# Patient Record
Sex: Male | Born: 1940 | Race: Black or African American | Hispanic: No | Marital: Married | Smoking: Never smoker
Health system: Southern US, Community
[De-identification: ages and names within clinical notes are randomized; demographics above are authoritative.]

## PROBLEM LIST (undated history)

## (undated) DIAGNOSIS — C61 Malignant neoplasm of prostate: Secondary | ICD-10-CM

## (undated) DIAGNOSIS — I10 Essential (primary) hypertension: Secondary | ICD-10-CM

## (undated) HISTORY — DX: Malignant neoplasm of prostate: C61

## (undated) HISTORY — DX: Essential (primary) hypertension: I10

## (undated) HISTORY — PX: CATARACT EXTRACTION: SUR2

---

## 2004-10-11 ENCOUNTER — Ambulatory Visit: Payer: Self-pay | Admitting: Radiation Oncology

## 2010-09-09 ENCOUNTER — Ambulatory Visit: Payer: Self-pay | Admitting: Family Medicine

## 2011-10-17 ENCOUNTER — Ambulatory Visit: Payer: Self-pay | Admitting: Family Medicine

## 2016-12-27 ENCOUNTER — Emergency Department: Payer: Medicare Other

## 2016-12-27 ENCOUNTER — Emergency Department
Admission: EM | Admit: 2016-12-27 | Discharge: 2016-12-27 | Disposition: A | Discharge status: Home / Self Care | Payer: Medicare Other | Attending: Student in an Organized Health Care Education/Training Program | Admitting: Student in an Organized Health Care Education/Training Program

## 2016-12-27 DIAGNOSIS — R55 Syncope and collapse: Secondary | ICD-10-CM | POA: Diagnosis not present

## 2016-12-27 DIAGNOSIS — Z7901 Long term (current) use of anticoagulants: Secondary | ICD-10-CM | POA: Insufficient documentation

## 2016-12-27 DIAGNOSIS — E860 Dehydration: Secondary | ICD-10-CM | POA: Diagnosis not present

## 2016-12-27 DIAGNOSIS — R51 Headache: Secondary | ICD-10-CM | POA: Diagnosis present

## 2016-12-27 LAB — BASIC METABOLIC PANEL
ANION GAP: 7 (ref 5–15)
BUN: 24 mg/dL — ABNORMAL HIGH (ref 6–20)
CHLORIDE: 106 mmol/L (ref 101–111)
CO2: 25 mmol/L (ref 22–32)
CREATININE: 1.21 mg/dL (ref 0.61–1.24)
Calcium: 9.6 mg/dL (ref 8.9–10.3)
GFR calc non Af Amer: 56 mL/min — ABNORMAL LOW (ref >60)
Glucose, Bld: 95 mg/dL (ref 65–99)
Potassium: 3.9 mmol/L (ref 3.5–5.1)
SODIUM: 138 mmol/L (ref 135–145)

## 2016-12-27 LAB — CBC
HCT: 34.4 % — ABNORMAL LOW (ref 40.0–52.0)
Hemoglobin: 11.3 g/dL — ABNORMAL LOW (ref 13.0–18.0)
MCH: 23.9 pg — ABNORMAL LOW (ref 26.0–34.0)
MCHC: 32.9 g/dL (ref 32.0–36.0)
MCV: 72.8 fL — ABNORMAL LOW (ref 80.0–100.0)
Platelets: 190 K/uL (ref 150–440)
RBC: 4.72 MIL/uL (ref 4.40–5.90)
RDW: 15.5 % — ABNORMAL HIGH (ref 11.5–14.5)
WBC: 7.2 K/uL (ref 3.8–10.6)

## 2016-12-27 LAB — TROPONIN I

## 2016-12-27 MED ORDER — SODIUM CHLORIDE 0.9 % IV BOLUS (SEPSIS)
500.0000 mL | Freq: Once | INTRAVENOUS | Status: AC
  Administered 2016-12-27: 500 mL via INTRAVENOUS

## 2016-12-27 NOTE — ED Triage Notes (Signed)
Pt arrives via [facility] from where he works. Pt told EMS he had a sharp pain in front of head and felt dizzy. Witnesses told EMS pt had a near syncopal episode. Pt states he had a dizzy spell and sat down, no LOC, no hitting head. Pt remembers event. Pt is alert and oriented on arrival. Negative stroke per EMS, VSS, CBG 130.

## 2016-12-27 NOTE — ED Notes (Signed)
Pt had stated to this RN that he couldn't find car keys. Pt was informed that this RN would contact EMS or communications to see if they found them in EMS truck. Family was with pt. Went back into room to let pt know that he would need to wait in lobby for EMS to call back about car keys but pt or family were neither in room.

## 2016-12-27 NOTE — ED Provider Notes (Signed)
[facility] Emergency Department Provider Note    First MD Initiated Contact with Patient 12/27/16 1529     (approximate)  I have reviewed the triage vital signs and the nursing notes.   HISTORY  Chief Complaint Near Syncope    HPI Darryl Garcia is a 76 y.o. male presents with complaint of frontal headache associated with feeling dizzy that occurred while at work today.  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  Denies any SOB or Cp.  No recent fevers.  Says that he was mopping the floor, became light headed and a fellow coworker saw him.  She came to help him to the bench, so there was no head injury.  He states that after sitting down on the bench for a time his symptoms improved.  Denies any recent fevers.  No blurry vision, confusion, numbness or tingling earlier this Am.  EMS was called after the episode. He was able to ambulate to the ambulance according to EMS.  He says that by the time EMS arrived his symptoms had resolved.     History reviewed. No pertinent past medical history. History reviewed. No pertinent family history. History reviewed. No pertinent surgical history. There are no active problems to display for this patient.     Prior to Admission medications   Medication Sig Start Date End Date Taking? Authorizing Provider  Enoxaparin Sodium (LOVENOX [place]) Inject 1 Package into the skin 2 (two) times daily.   Yes Historical Provider, MD    Allergies Patient has no known allergies.    Social History Social History  Substance Use Topics  . Smoking status: Never Smoker  . Smokeless tobacco: Not on file  . Alcohol use No    Review of Systems Patient denies headaches, rhinorrhea, blurry vision, numbness, shortness of breath, chest pain, edema, cough, abdominal pain, nausea, vomiting, diarrhea, dysuria, fevers, rashes or hallucinations unless otherwise stated above in HPI. ____________________________________________   PHYSICAL  EXAM:  VITAL SIGNS: Vitals:   12/27/16 1830 12/27/16 1845  BP: (!) 149/91 (!) 143/88  Pulse: 78 73  Resp: 19 (!) 21    Constitutional: Alert and oriented. Well appearing and in no acute distress. Eyes: Conjunctivae are normal. PERRL. EOMI. Head: Atraumatic. Nose: No congestion/rhinnorhea. Mouth/Throat: Mucous membranes are moist.  Oropharynx non-erythematous. Neck: No stridor. Painless ROM. No cervical spine tenderness to palpation Hematological/Lymphatic/Immunilogical: No cervical lymphadenopathy. Cardiovascular: Normal rate, regular rhythm. Grossly normal heart sounds.  Good peripheral circulation. Respiratory: Normal respiratory effort.  No retractions. Lungs CTAB. Gastrointestinal: Soft and nontender. No distention. No abdominal bruits. No CVA tenderness.  Musculoskeletal: No lower extremity tenderness nor edema.  No joint effusions. Neurologic:  CN- intact.  No facial droop, Normal FNF.  Normal heel to shin.  Sensation intact bilaterally. Normal speech and language. No gross focal neurologic deficits are appreciated. No gait instability.  Skin:  Skin is warm, dry and intact. No rash noted. Psychiatric: Mood and affect are normal. Speech and behavior are normal.  ____________________________________________   LABS (all labs ordered are listed, but only abnormal results are displayed)  Results for orders placed or performed during the hospital encounter of 12/27/16 (from the past 24 hour(s))  Basic metabolic panel     Status: Abnormal   Collection Time: 12/27/16  3:21 PM  Result Value Ref Range   Sodium 138 135 - 145 mmol/L   Potassium 3.9 3.5 - 5.1 mmol/L   Chloride 106 101 - 111 mmol/L   CO2 25 22 -  32 mmol/L   Glucose, Bld 95 65 - 99 mg/dL   BUN 24 (H) 6 - 20 mg/dL   Creatinine, Ser 1.21 0.61 - 1.24 mg/dL   Calcium 9.6 8.9 - 10.3 mg/dL   GFR calc non Af Amer 56 (L) >60 mL/min   GFR calc Af Amer >60 >60 mL/min   Anion gap 7 5 - 15  CBC     Status: Abnormal    Collection Time: 12/27/16  3:21 PM  Result Value Ref Range   WBC 7.2 3.8 - 10.6 K/uL   RBC 4.72 4.40 - 5.90 MIL/uL   Hemoglobin 11.3 (L) 13.0 - 18.0 g/dL   HCT 34.4 (L) 40.0 - 52.0 %   MCV 72.8 (L) 80.0 - 100.0 fL   MCH 23.9 (L) 26.0 - 34.0 pg   MCHC 32.9 32.0 - 36.0 g/dL   RDW 15.5 (H) 11.5 - 14.5 %   Platelets 190 150 - 440 K/uL  Troponin I     Status: None   Collection Time: 12/27/16  3:21 PM  Result Value Ref Range   Troponin I <0.03 <0.03 ng/mL   ____________________________________________  EKG My review and personal interpretation at Time: 15:20   Indication: near syncope  Rate: 90  Rhythm: sinus Axis: normal Other: non specific st changes, normal intervals ____________________________________________  RADIOLOGY  I personally reviewed all radiographic images ordered to evaluate for the above acute complaints and reviewed radiology reports and findings.  These findings were personally discussed with the patient.  Please see medical record for radiology report.  ____________________________________________   PROCEDURES  Procedure(s) performed:  Procedures    Critical Care performed: no ____________________________________________   INITIAL IMPRESSION / ASSESSMENT AND PLAN / ED COURSE  Pertinent labs & imaging results that were available during my care of the patient were reviewed by me and considered in my medical decision making (see chart for details).  DDX: dehydration, tia, seizure, acs, dysrhythmia, migrain, cluster, sah, sdh  [name] is a 76 y.o. who presents to the ED with headache and near syncopal episode.  Patient is AFVSS in ED. Exam as above. Given current presentation have considered the above differential.  CT head ordered due to concern for AICA shows stable small vessel changes.  No deficits at this time.  Description sounds less like cva or tia.  Possible dehydration.  EKG without ischemia or dysrhythmia.  Trop negative.  Do not feel this  is consistent with ACS or dysrhythmia as he had no chest pain, palpitations or SOB.  Abdominal exam is soft and benign.  No pulsatile mass.  Will provide IVF and reassess.    Clinical Course as of Dec 28 2207  Tue Dec 27, 2016  1753 Patient reassessed. Remains asymptomatic. He is tolerating oral hydration. Mild dehydration on labs but no AKI. Patient receiving IV fluids.  Not consistent with ACS. No evidence of acute intracranial abnormality. No evidence of heart failure or pneumonia.  Patient was able to tolerate PO and was able to ambulate with a steady gait.  Have discussed with the patient and available family all diagnostics and treatments performed thus far and all questions were answered to the best of my ability. The patient demonstrates understanding and agreement with plan.   [PR]    Clinical Course User Index [PR] Merlyn Lot, MD     ____________________________________________   FINAL CLINICAL IMPRESSION(S) / ED DIAGNOSES  Final diagnoses:  Near syncope  Dehydration      NEW MEDICATIONS STARTED DURING  THIS VISIT:  There are no discharge medications for this patient.    Note:  This document was prepared using Dragon voice recognition software and may include unintentional dictation errors.    Merlyn Lot, MD 12/27/16 2211

## 2016-12-27 NOTE — ED Notes (Signed)
Pt states he ate cereal and peanut butter today, that's what he normally eats. Pt denies blurred vision, talking in complete sentences. Pt states pressure on forehead. Pt denies hx of headaches. Denies dizziness at present time. Denies N&V. States he feels fine and is ready to go.

## 2016-12-27 NOTE — ED Notes (Signed)
Pt ambulated with no assistance. No dizziness, no blurred vision. Pt feels well.

## 2017-05-31 IMAGING — CT CT HEAD W/O CM
3 series · 15 of 47 positions shown, 18 images · non-contrast
Comparison: None.

CLINICAL DATA: Dizziness, frontal pain, near syncopal episode

EXAM:
CT HEAD WITHOUT CONTRAST
TECHNIQUE: Contiguous axial images were obtained from the base of the skull
through the vertex without intravenous contrast.

[Series 2: head wo · axial · 0.43mm/px · z∈[-111,+14]mm · 9 of 31 slices shown, 12 images]
[im 3/31  brain]
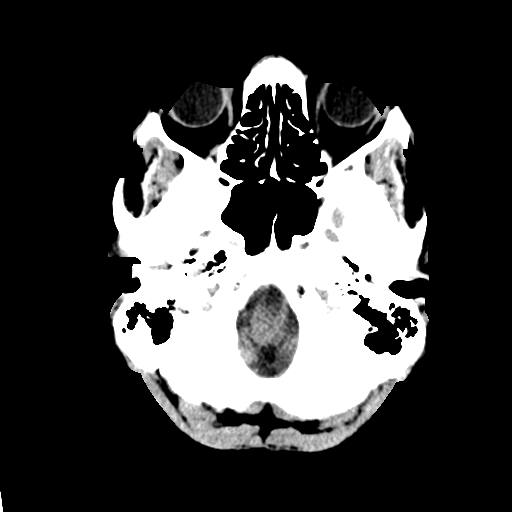
[im 3/31  bone]
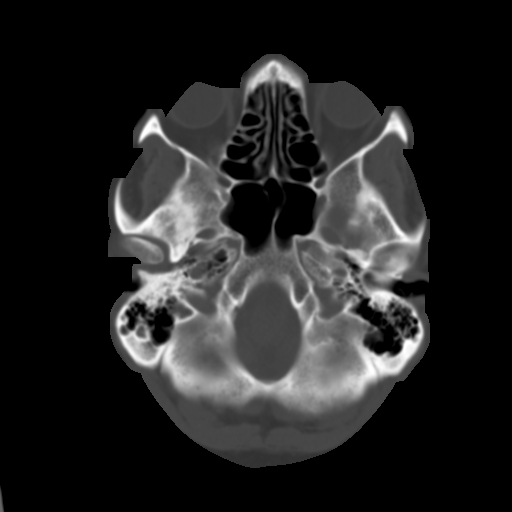
[im 6/31  brain]
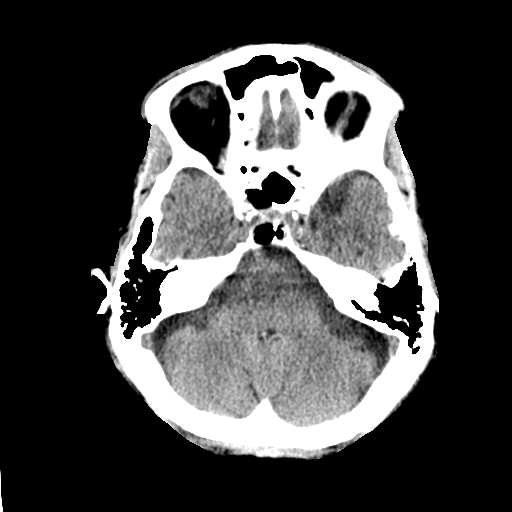
[im 9/31  brain]
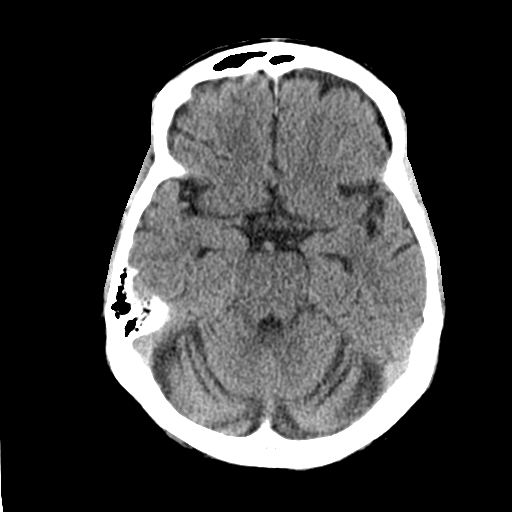
[im 12/31  brain]
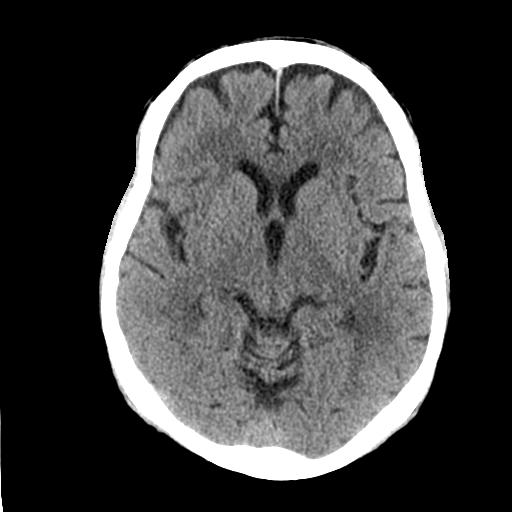
[im 16/31  brain]
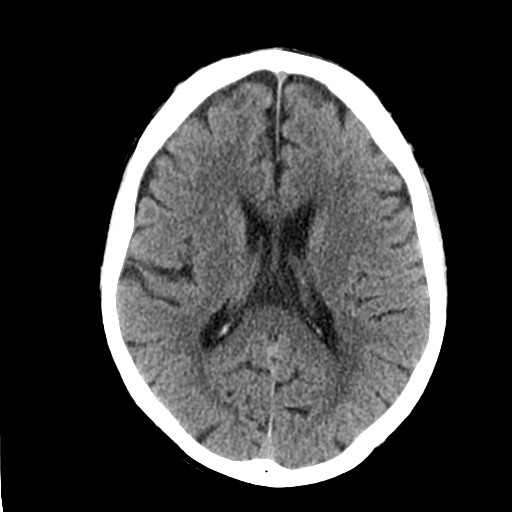
[im 16/31  bone]
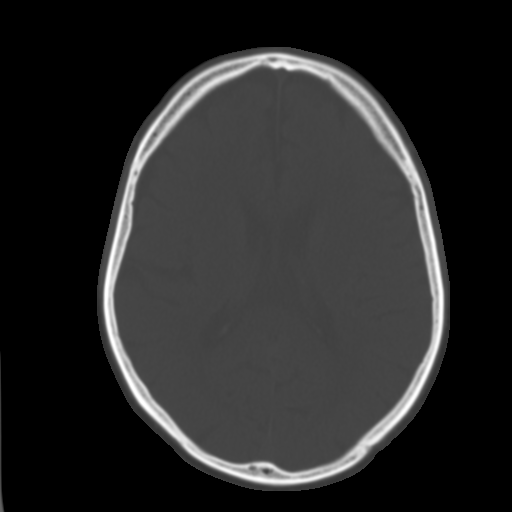
[im 19/31  brain]
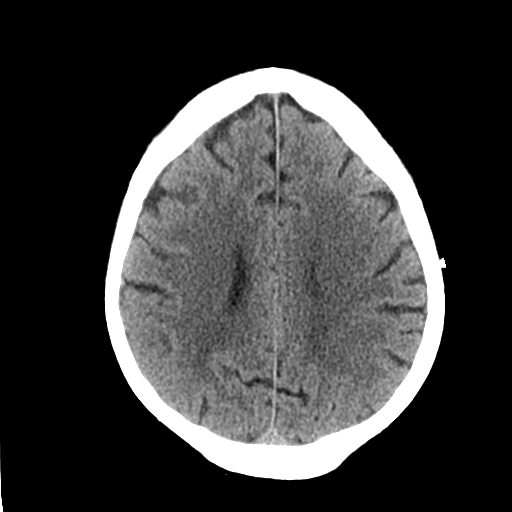
[im 22/31  brain]
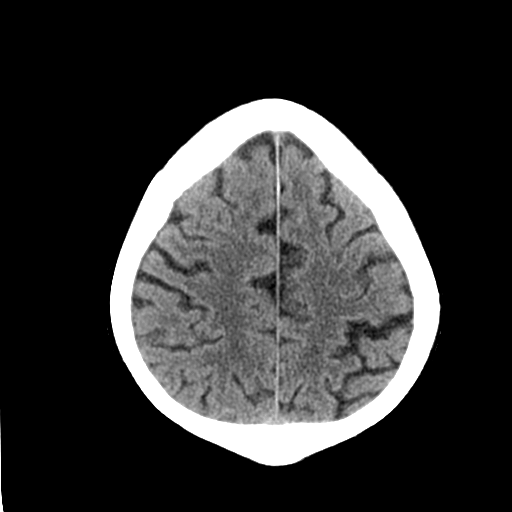
[im 25/31  brain]
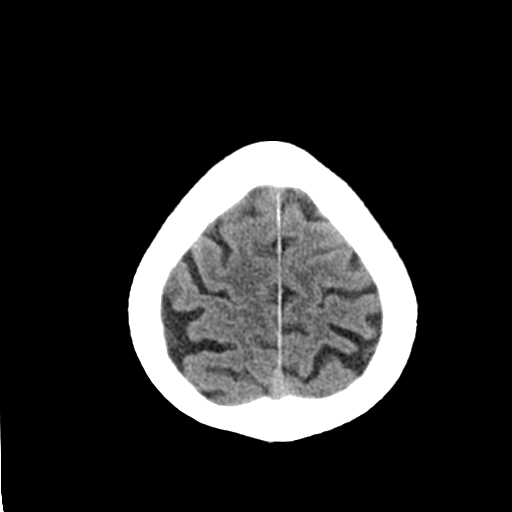
[im 28/31  brain]
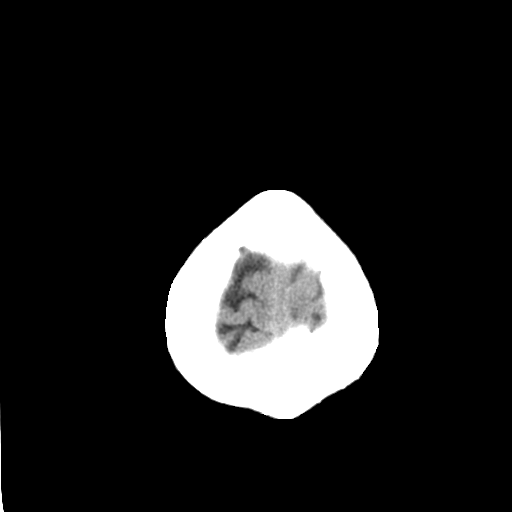
[im 28/31  bone]
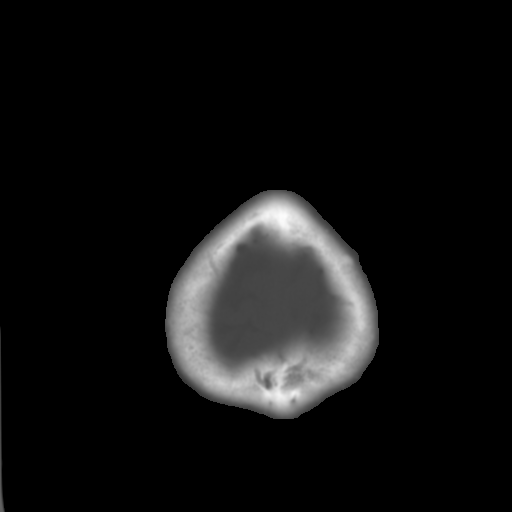

[Series 4: coronal soft tissue · coronal · 0.33mm/px · 3 of 65 slices shown]
[im 22/65  brain]
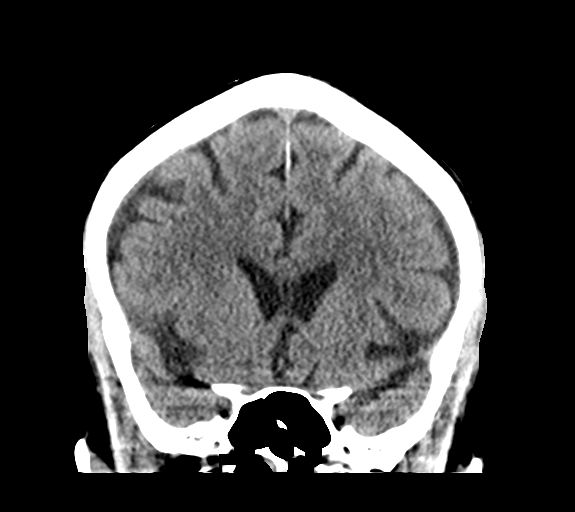
[im 29/65  brain]
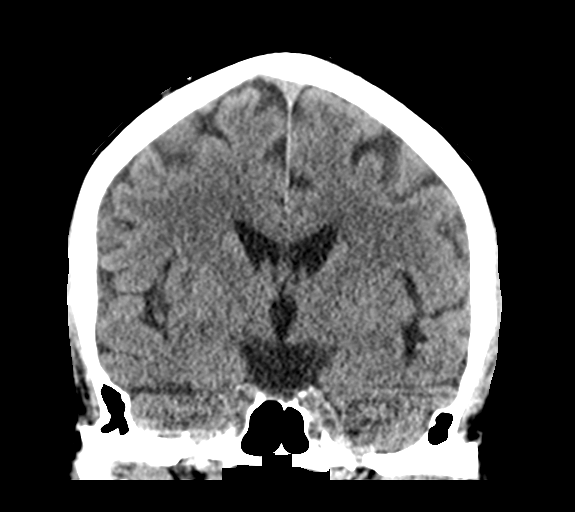
[im 36/65  brain]
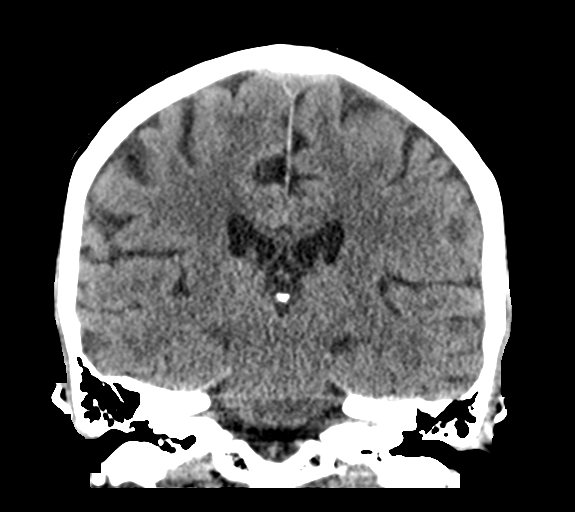

[Series 5: sagittal soft tissue · sagittal · 0.31mm/px · 3 of 54 slices shown]
[im 18/54  brain]
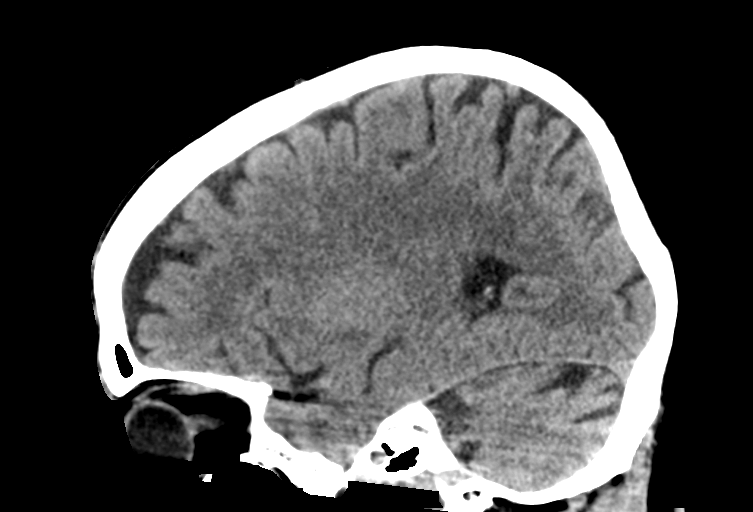
[im 27/54  brain]
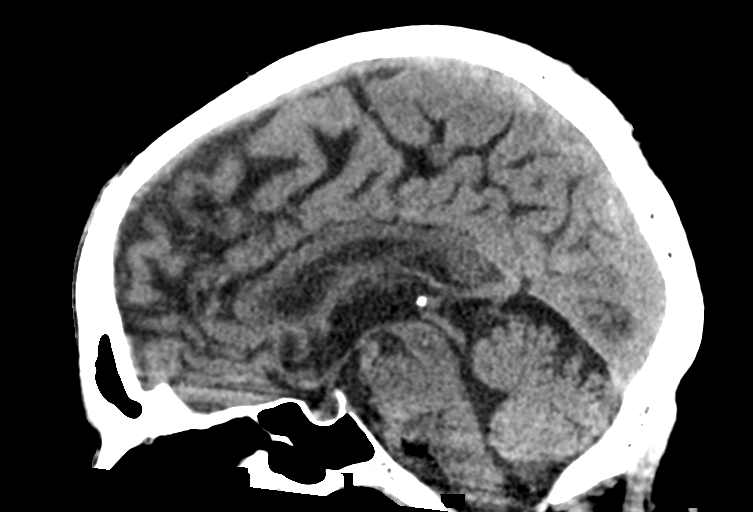
[im 36/54  brain]
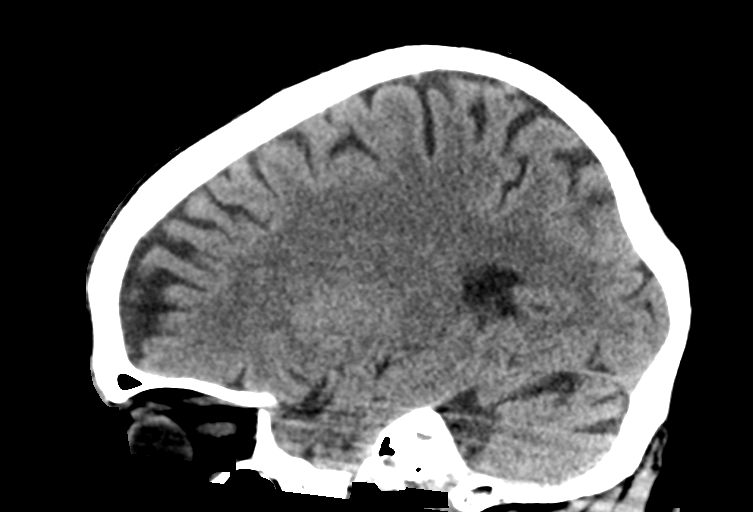

[15 of 47 positions shown; findings below may reference images not displayed]

FINDINGS: Brain: The ventricular system is normal in size and configuration
and the septum is midline in position. There are somewhat prominent
cortical sulci and cerebellar folia present consistent with atrophy.
Mild small vessel ischemic change is noted in the periventricular
white matter. No hemorrhage, mass lesion, or acute infarction is
seen.

Vascular: No vascular abnormality is seen on this unenhanced study.

Skull: No calvarial abnormality is seen.

Sinuses/Orbits: The paranasal sinuses that are visualized are
pneumatized.

Other: None.
IMPRESSION: Atrophy and mild small vessel ischemic change. No acute intracranial
abnormality.

## 2022-03-10 ENCOUNTER — Encounter: Payer: Self-pay | Admitting: Nurse Practitioner

## 2022-03-10 ENCOUNTER — Ambulatory Visit (INDEPENDENT_AMBULATORY_CARE_PROVIDER_SITE_OTHER): Payer: Medicare HMO | Admitting: Nurse Practitioner

## 2022-03-10 VITALS — BP 125/66 | HR 68 | Ht 70.0 in | Wt 174.9 lb

## 2022-03-10 DIAGNOSIS — I10 Essential (primary) hypertension: Secondary | ICD-10-CM

## 2022-03-10 DIAGNOSIS — C61 Malignant neoplasm of prostate: Secondary | ICD-10-CM | POA: Diagnosis not present

## 2022-03-10 DIAGNOSIS — D649 Anemia, unspecified: Secondary | ICD-10-CM | POA: Diagnosis not present

## 2022-03-10 DIAGNOSIS — Z7689 Persons encountering health services in other specified circumstances: Secondary | ICD-10-CM | POA: Diagnosis not present

## 2022-03-10 NOTE — Progress Notes (Signed)
? ?New Patient Office Visit ? ?Subjective:  ?Patient ID: Darryl Garcia, male    DOB: 07-29-1941  Age: 81 y.o. MRN: [id number] ? ?CC:  ?Chief Complaint  ?Patient presents with  ? New Patient (Initial Visit)  ? ? ?HPI ?Darryl Garcia presents for establishing care. 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  ? ?Patient has h/o hypertension, anemia, malignant neoplasm of prostate, choroidal nevus, complex regional pain syndrome and cataract. ? ?Past Medical History:  ?Diagnosis Date  ? Hypertension   ? Prostate cancer ([place])   ? ? ?Past Surgical History:  ?Procedure Laterality Date  ? CATARACT EXTRACTION N/A   ? ? ?History reviewed. No pertinent family history. ? ?Social History  ? ?Socioeconomic History  ? Marital status: Married  ?  Spouse name: Not on file  ? Number of children: Not on file  ? Years of education: Not on file  ? Highest education level: Not on file  ?Occupational History  ? Not on file  ?Tobacco Use  ? Smoking status: Never  ? Smokeless tobacco: Not on file  ?Substance and Sexual Activity  ? Alcohol use: No  ? Drug use: Never  ? Sexual activity: Not Currently  ?Other Topics Concern  ? Not on file  ?Social History Narrative  ? Not on file  ? ?Social Determinants of Health  ? ?Financial Resource Strain: Not on file  ?Food Insecurity: Not on file  ?Transportation Needs: Not on file  ?Physical Activity: Not on file  ?Stress: Not on file  ?Social Connections: Not on file  ?Intimate Partner Violence: Not on file  ? ? ?ROS ?Review of Systems  ?Constitutional:  Negative for activity change and appetite change.  ?HENT:  Positive for hearing loss. Negative for congestion, ear discharge and sore throat.   ?Eyes: Negative.   ?Respiratory:  Negative for cough, chest tightness and wheezing.   ?Cardiovascular:  Negative for chest pain and palpitations.  ?Gastrointestinal:  Negative for abdominal distention and blood in stool.  ?Genitourinary:  Positive for frequency and urgency.   ?Musculoskeletal:  Positive for myalgias.  ?Skin:  Negative for color change and pallor.  ?Neurological:  Negative for dizziness, light-headedness and headaches.  ?Psychiatric/Behavioral:  Negative for agitation, behavioral problems and confusion.   ? ?Objective:  ? ?Today's Vitals: BP 125/66   Pulse 68   Ht '5\' 10"'$  (1.778 m)   Wt 174 lb 14.4 oz (79.3 kg)   BMI 25.10 kg/m?  ? ?Physical Exam ?Constitutional:   ?   Appearance: Normal appearance. He is well-developed.  ?HENT:  ?   Head: Normocephalic.  ?   Right Ear: Tympanic membrane normal.  ?   Left Ear: Tympanic membrane normal.  ?   Mouth/Throat:  ?   Lips: Pink.  ?   Mouth: Mucous membranes are moist.  ?   Pharynx: Oropharynx is clear.  ?Eyes:  ?   Extraocular Movements: Extraocular movements intact.  ?   Conjunctiva/sclera: Conjunctivae normal.  ?   Pupils: Pupils are equal, round, and reactive to light.  ?Cardiovascular:  ?   Rate and Rhythm: Normal rate and regular rhythm.  ?   Pulses: Normal pulses.  ?   Heart sounds: Normal heart sounds.  ?Pulmonary:  ?   Effort: Pulmonary effort is normal.  ?   Breath sounds: Normal breath sounds.  ?Abdominal:  ?   General: Bowel sounds are normal.  ?   Palpations: Abdomen is soft.  ?Musculoskeletal:  ?  Cervical back: Normal range of motion.  ?Skin: ?   Capillary Refill: Capillary refill takes less than 2 seconds.  ?Neurological:  ?   General: No focal deficit present.  ?   Mental Status: He is alert and oriented to person, place, and time. Mental status is at baseline.  ?Psychiatric:     ?   Mood and Affect: Mood normal.     ?   Behavior: Behavior normal.     ?   Thought Content: Thought content normal.     ?   Judgment: Judgment normal.  ? ? ?Assessment & Plan:  ? ?Problem List Items Addressed This Visit   ? ?  ? Cardiovascular and Mediastinum  ? Hypertension  ?  BP 125/66 in the office ?Continue the current medication lisinopril and amlodipine.  ?  ?  ? Relevant Medications  ? amLODipine (NORVASC) 10 MG tablet  ?  aspirin 81 MG EC tablet  ? lisinopril (ZESTRIL) 20 MG tablet  ?  ? Genitourinary  ? Prostate cancer ([place])  ?  Followed by the oncologist. ?  ?  ? Relevant Medications  ? abiraterone acetate (ZYTIGA) 250 MG tablet  ? aspirin 81 MG EC tablet  ? predniSONE (DELTASONE) 5 MG tablet  ?  ? Other  ? Encounter to establish care with new doctor - Primary  ?  Care established. ?Labs ordered. ?  ?  ? Anemia  ?  Continue taking iron supplement. ?  ?  ? Relevant Medications  ? ferrous sulfate 325 (65 FE) MG tablet  ? ? ?Outpatient Encounter Medications as of 03/10/2022  ?Medication Sig  ? abiraterone acetate (ZYTIGA) 250 MG tablet Take 250 mg by mouth in the morning, at noon, in the evening, and at bedtime.  ? amLODipine (NORVASC) 10 MG tablet Take 1 tablet by mouth daily.  ? aspirin 81 MG EC tablet Take 81 mg by mouth daily at 2 PM.  ? ferrous sulfate 325 (65 FE) MG tablet Take 1 tablet by mouth daily.  ? lisinopril (ZESTRIL) 20 MG tablet Take 10 mg by mouth daily at 2 PM.  ? predniSONE (DELTASONE) 5 MG tablet Take 5 mg by mouth every other day.  ? Enoxaparin Sodium (LOVENOX [place]) Inject 1 Package into the skin 2 (two) times daily.  ? ?No facility-administered encounter medications on file as of 03/10/2022.  ? ? ?Follow-up: No follow-ups on file.  ? ?Theresia Lo, NP ? ?

## 2022-03-11 ENCOUNTER — Encounter: Payer: Self-pay | Admitting: Nurse Practitioner

## 2022-03-11 ENCOUNTER — Ambulatory Visit (INDEPENDENT_AMBULATORY_CARE_PROVIDER_SITE_OTHER): Payer: Medicare HMO

## 2022-03-11 DIAGNOSIS — Z0000 Encounter for general adult medical examination without abnormal findings: Secondary | ICD-10-CM | POA: Diagnosis not present

## 2022-03-11 DIAGNOSIS — Z7689 Persons encountering health services in other specified circumstances: Secondary | ICD-10-CM

## 2022-03-11 NOTE — Addendum Note (Signed)
Addended by: Anson [place] R on: 03/11/2022 02:09 PM ? ? Modules accepted: Orders ? ?

## 2022-03-12 ENCOUNTER — Encounter: Payer: Self-pay | Admitting: Nurse Practitioner

## 2022-03-12 DIAGNOSIS — I10 Essential (primary) hypertension: Secondary | ICD-10-CM | POA: Insufficient documentation

## 2022-03-12 DIAGNOSIS — Z7689 Persons encountering health services in other specified circumstances: Secondary | ICD-10-CM | POA: Insufficient documentation

## 2022-03-12 DIAGNOSIS — C61 Malignant neoplasm of prostate: Secondary | ICD-10-CM | POA: Insufficient documentation

## 2022-03-12 DIAGNOSIS — D649 Anemia, unspecified: Secondary | ICD-10-CM | POA: Insufficient documentation

## 2022-03-12 LAB — COMPLETE METABOLIC PANEL WITH GFR
AG Ratio: 1.7 (calc) (ref 1.0–2.5)
ALT: 21 U/L (ref 9–46)
AST: 19 U/L (ref 10–35)
Albumin: 4.2 g/dL (ref 3.6–5.1)
Alkaline phosphatase (APISO): 110 U/L (ref 35–144)
BUN: 17 mg/dL (ref 7–25)
CO2: 27 mmol/L (ref 20–32)
Calcium: 10 mg/dL (ref 8.6–10.3)
Chloride: 107 mmol/L (ref 98–110)
Creat: 1.22 mg/dL (ref 0.70–1.22)
Globulin: 2.5 g/dL (calc) (ref 1.9–3.7)
Glucose, Bld: 94 mg/dL (ref 65–99)
Potassium: 4.7 mmol/L (ref 3.5–5.3)
Sodium: 144 mmol/L (ref 135–146)
Total Bilirubin: 0.4 mg/dL (ref 0.2–1.2)
Total Protein: 6.7 g/dL (ref 6.1–8.1)
eGFR: 59 mL/min/{1.73_m2} — ABNORMAL LOW (ref >=60)

## 2022-03-12 LAB — CBC WITH DIFFERENTIAL/PLATELET
Absolute Monocytes: 281 cells/uL (ref 200–950)
Basophils Absolute: 18 cells/uL (ref 0–200)
Basophils Relative: 0.3 %
Eosinophils Absolute: 49 cells/uL (ref 15–500)
Eosinophils Relative: 0.8 %
HCT: 35.4 % — ABNORMAL LOW (ref 38.5–50.0)
Hemoglobin: 11.2 g/dL — ABNORMAL LOW (ref 13.2–17.1)
Lymphs Abs: 1403 cells/uL (ref 850–3900)
MCH: 23.9 pg — ABNORMAL LOW (ref 27.0–33.0)
MCHC: 31.6 g/dL — ABNORMAL LOW (ref 32.0–36.0)
MCV: 75.6 fL — ABNORMAL LOW (ref 80.0–100.0)
MPV: 12.7 fL — ABNORMAL HIGH (ref 7.5–12.5)
Monocytes Relative: 4.6 %
Neutro Abs: 4349 cells/uL (ref 1500–7800)
Neutrophils Relative %: 71.3 %
Platelets: 153 10*3/uL (ref 140–400)
RBC: 4.68 10*6/uL (ref 4.20–5.80)
RDW: 15.7 % — ABNORMAL HIGH (ref 11.0–15.0)
Total Lymphocyte: 23 %
WBC: 6.1 10*3/uL (ref 3.8–10.8)

## 2022-03-12 LAB — LIPID PANEL
Cholesterol: 215 mg/dL — ABNORMAL HIGH (ref <200)
HDL: 94 mg/dL (ref >=40)
LDL Cholesterol (Calc): 108 mg/dL (calc) — ABNORMAL HIGH
Non-HDL Cholesterol (Calc): 121 mg/dL (calc) (ref <130)
Total CHOL/HDL Ratio: 2.3 (calc) (ref <5.0)
Triglycerides: 44 mg/dL (ref <150)

## 2022-03-12 LAB — PSA: PSA: <0.04 ng/mL (ref <=4.00)

## 2022-03-12 LAB — TSH: TSH: 0.05 mIU/L — ABNORMAL LOW (ref 0.40–4.50)

## 2022-03-12 NOTE — Assessment & Plan Note (Signed)
Care established. Labs ordered. 

## 2022-03-12 NOTE — Assessment & Plan Note (Signed)
Continue taking iron supplement. ?

## 2022-03-12 NOTE — Assessment & Plan Note (Signed)
Followed by the oncologist.  

## 2022-03-12 NOTE — Assessment & Plan Note (Signed)
BP 125/66 in the office ?Continue the current medication lisinopril and amlodipine.  ?

## 2022-03-17 ENCOUNTER — Ambulatory Visit (INDEPENDENT_AMBULATORY_CARE_PROVIDER_SITE_OTHER): Payer: Medicare HMO | Admitting: Nurse Practitioner

## 2022-03-17 ENCOUNTER — Encounter: Payer: Self-pay | Admitting: Nurse Practitioner

## 2022-03-17 VITALS — BP 140/63 | HR 55 | Ht 70.0 in | Wt 175.4 lb

## 2022-03-17 DIAGNOSIS — D649 Anemia, unspecified: Secondary | ICD-10-CM

## 2022-03-17 DIAGNOSIS — R2689 Other abnormalities of gait and mobility: Secondary | ICD-10-CM

## 2022-03-17 DIAGNOSIS — E785 Hyperlipidemia, unspecified: Secondary | ICD-10-CM | POA: Diagnosis not present

## 2022-03-17 MED ORDER — FOLDING WALKING CANE MISC: 1.0000 | 0 refills | Status: AC | PRN

## 2022-03-17 MED ORDER — FOLDING WALKING CANE MISC: 1.0000 | 0 refills | Status: DC | PRN

## 2022-03-17 NOTE — Progress Notes (Signed)
? ?New Patient Office Visit ? ?Subjective:  ?Patient ID: Darryl Garcia, male    DOB: Sep 26, 1941  Age: 81 y.o. MRN: [id number] ? ?CC:  ?Chief Complaint  ?Patient presents with  ? Lab Results  ? ? ?HPI ? ?Patient has h/o hypertension, anemia, malignant neoplasm of prostate, choroidal nevus, complex regional pain syndrome and cataract. Patient states that he has balance problem while walking.  ? ?Past Medical History:  ?Diagnosis Date  ? Hypertension   ? Prostate cancer ([place])   ? ? ?Past Surgical History:  ?Procedure Laterality Date  ? CATARACT EXTRACTION N/A   ? ? ?History reviewed. No pertinent family history. ? ?Social History  ? ?Socioeconomic History  ? Marital status: Married  ?  Spouse name: Not on file  ? Number of children: Not on file  ? Years of education: Not on file  ? Highest education level: Not on file  ?Occupational History  ? Not on file  ?Tobacco Use  ? Smoking status: Never  ? Smokeless tobacco: Not on file  ?Substance and Sexual Activity  ? Alcohol use: No  ? Drug use: Never  ? Sexual activity: Not Currently  ?Other Topics Concern  ? Not on file  ?Social History Narrative  ? Not on file  ? ?Social Determinants of Health  ? ?Financial Resource Strain: Not on file  ?Food Insecurity: Not on file  ?Transportation Needs: Not on file  ?Physical Activity: Not on file  ?Stress: Not on file  ?Social Connections: Not on file  ?Intimate Partner Violence: Not on file  ? ? ?ROS ?Review of Systems  ?Constitutional:  Negative for activity change and appetite change.  ?HENT:  Positive for hearing loss. Negative for congestion, ear discharge and sore throat.   ?Eyes: Negative.   ?Respiratory:  Negative for cough, chest tightness and wheezing.   ?Cardiovascular:  Negative for chest pain and palpitations.  ?Gastrointestinal:  Negative for abdominal distention and blood in stool.  ?Genitourinary:  Positive for frequency and urgency.  ?Musculoskeletal:  Positive for myalgias.  ?Skin:  Negative for color change and  pallor.  ?Neurological:  Negative for dizziness, light-headedness and headaches.  ?Psychiatric/Behavioral:  Negative for agitation, behavioral problems and confusion.   ? ?Objective:  ? ?Today's Vitals: BP 140/63   Pulse (!) 55   Ht '5\' 10"'$  (1.778 m)   Wt 175 lb 6.4 oz (79.6 kg)   BMI 25.17 kg/m?  ? ?Physical Exam ?Constitutional:   ?   Appearance: Normal appearance. He is well-developed.  ?HENT:  ?   Head: Normocephalic.  ?   Right Ear: Tympanic membrane normal.  ?   Left Ear: Tympanic membrane normal.  ?   Mouth/Throat:  ?   Lips: Pink.  ?   Mouth: Mucous membranes are moist.  ?   Pharynx: Oropharynx is clear.  ?Eyes:  ?   Extraocular Movements: Extraocular movements intact.  ?   Conjunctiva/sclera: Conjunctivae normal.  ?   Pupils: Pupils are equal, round, and reactive to light.  ?Cardiovascular:  ?   Rate and Rhythm: Normal rate and regular rhythm.  ?   Pulses: Normal pulses.  ?   Heart sounds: Normal heart sounds.  ?Pulmonary:  ?   Effort: Pulmonary effort is normal.  ?   Breath sounds: Normal breath sounds.  ?Abdominal:  ?   General: Bowel sounds are normal.  ?   Palpations: Abdomen is soft.  ?Musculoskeletal:  ?   Cervical back: Normal range of motion.  ?Skin: ?  Capillary Refill: Capillary refill takes less than 2 seconds.  ?Neurological:  ?   General: No focal deficit present.  ?   Mental Status: He is alert and oriented to person, place, and time. Mental status is at baseline.  ?Psychiatric:     ?   Mood and Affect: Mood normal.     ?   Behavior: Behavior normal.     ?   Thought Content: Thought content normal.     ?   Judgment: Judgment normal.  ? ? ?Assessment & Plan:  ? ?Problem List Items Addressed This Visit   ? ?  ? Other  ? Anemia  ?  Hb 11.2 ?Continue taking the fe supplement with orange juice.  ?  ?  ? Balance disorder  ?  Patient states he has loss of balance while walking. ?Patient has bowed legs. ?Encourage patient to use cane while walking. ?Ordered folding cane.  ? ?  ?  ? Hyperlipidemia  - Primary  ?  Total cholesterol 215 and LDL 108 ?Advised patient to watch diet and eat healthy. ?Lowering the levels of LDL helps reduce the risk of cardiovascular diseases.  ?LDL can be lowered via lifestyle modification and pharmacological therapy.  ?Eat healthy diet and  perform regular aerobic exercise ?  ?  ?  ? ?Outpatient Encounter Medications as of 03/17/2022  ?Medication Sig  ? abiraterone acetate (ZYTIGA) 250 MG tablet Take 250 mg by mouth in the morning, at noon, in the evening, and at bedtime.  ? amLODipine (NORVASC) 10 MG tablet Take 1 tablet by mouth daily.  ? aspirin 81 MG EC tablet Take 81 mg by mouth daily at 2 PM.  ? Enoxaparin Sodium (LOVENOX [place]) Inject 1 Package into the skin 2 (two) times daily.  ? ferrous sulfate 325 (65 FE) MG tablet Take 1 tablet by mouth daily.  ? lisinopril (ZESTRIL) 20 MG tablet Take 10 mg by mouth daily at 2 PM.  ? Misc. Devices (FOLDING WALKING CANE) MISC 1 each by Does not apply route as needed.  ? predniSONE (DELTASONE) 5 MG tablet Take 5 mg by mouth every other day.  ? [DISCONTINUED] Misc. Devices (FOLDING WALKING CANE) MISC 1 each by Does not apply route as needed.  ? ?No facility-administered encounter medications on file as of 03/17/2022.  ? ? ?Follow-up: No follow-ups on file.  ? ? ?Theresia Lo, NP ? ?

## 2022-03-24 DIAGNOSIS — E785 Hyperlipidemia, unspecified: Secondary | ICD-10-CM | POA: Insufficient documentation

## 2022-03-24 DIAGNOSIS — G9050 Complex regional pain syndrome I, unspecified: Secondary | ICD-10-CM | POA: Insufficient documentation

## 2022-03-24 DIAGNOSIS — R2689 Other abnormalities of gait and mobility: Secondary | ICD-10-CM | POA: Insufficient documentation

## 2022-03-24 NOTE — Assessment & Plan Note (Signed)
Total cholesterol 215 and LDL 108 ?Advised patient to watch diet and eat healthy. ?Lowering the levels of LDL helps reduce the risk of cardiovascular diseases.  ?LDL can be lowered via lifestyle modification and pharmacological therapy.  ?Eat healthy diet and  perform regular aerobic exercise ?  ?

## 2022-03-24 NOTE — Assessment & Plan Note (Signed)
Hb 11.2 ?Continue taking the fe supplement with orange juice.  ?

## 2022-03-24 NOTE — Assessment & Plan Note (Signed)
Patient states he has loss of balance while walking. ?Patient has bowed legs. ?Encourage patient to use cane while walking. ?Ordered folding cane.  ? ?

## 2022-03-25 ENCOUNTER — Ambulatory Visit (INDEPENDENT_AMBULATORY_CARE_PROVIDER_SITE_OTHER): Payer: Medicare HMO | Admitting: Nurse Practitioner

## 2022-03-25 VITALS — BP 118/54 | HR 66 | Ht 70.0 in | Wt 175.3 lb

## 2022-03-25 DIAGNOSIS — Z0279 Encounter for issue of other medical certificate: Secondary | ICD-10-CM

## 2022-03-25 NOTE — Progress Notes (Signed)
? ?Established Patient Office Visit ? ?Subjective:  ?Patient ID: Darryl Garcia, male    DOB: [birth year]  Age: 81 y.o. MRN: [id number] ? ?CC:  ?Chief Complaint  ?Patient presents with  ? Follow-up  ?  Patient is here to have paperwork done  ? ? ? ?HPI ? ?Darryl Garcia presents for paperwork regarding "Examination for housebound status or permanent need for regular aid and attendance." Patient is a veteran and lives with her wife.   ? ?HPI  ? ?Past Medical History:  ?Diagnosis Date  ? Hypertension   ? Prostate cancer ([place])   ? ? ?Past Surgical History:  ?Procedure Laterality Date  ? CATARACT EXTRACTION N/A   ? ? ?No family history on file. ? ?Social History  ? ?Socioeconomic History  ? Marital status: Married  ?  Spouse name: Not on file  ? Number of children: Not on file  ? Years of education: Not on file  ? Highest education level: Not on file  ?Occupational History  ? Not on file  ?Tobacco Use  ? Smoking status: Never  ? Smokeless tobacco: Not on file  ?Substance and Sexual Activity  ? Alcohol use: No  ? Drug use: Never  ? Sexual activity: Not Currently  ?Other Topics Concern  ? Not on file  ?Social History Narrative  ? Not on file  ? ?Social Determinants of Health  ? ?Financial Resource Strain: Not on file  ?Food Insecurity: Not on file  ?Transportation Needs: Not on file  ?Physical Activity: Not on file  ?Stress: Not on file  ?Social Connections: Not on file  ?Intimate Partner Violence: Not on file  ? ? ? ?Outpatient Medications Prior to Visit  ?Medication Sig Dispense Refill  ? abiraterone acetate (ZYTIGA) 250 MG tablet Take 250 mg by mouth in the morning, at noon, in the evening, and at bedtime.    ? amLODipine (NORVASC) 10 MG tablet Take 1 tablet by mouth daily.    ? aspirin 81 MG EC tablet Take 81 mg by mouth daily at 2 PM.    ? Enoxaparin Sodium (LOVENOX [place]) Inject 1 Package into the skin 2 (two) times daily.    ? ferrous sulfate 325 (65 FE) MG tablet Take 1 tablet by mouth daily.    ? lisinopril (ZESTRIL)  20 MG tablet Take 10 mg by mouth daily at 2 PM.    ? Misc. Devices (FOLDING WALKING CANE) MISC 1 each by Does not apply route as needed. 1 each 0  ? predniSONE (DELTASONE) 5 MG tablet Take 5 mg by mouth every other day.    ? ?No facility-administered medications prior to visit.  ? ? ?No Known Allergies ? ?ROS ?Review of Systems ? ?  ?Objective:  ?  ?Physical Exam ? ?BP (!) 118/54   Pulse 66   Ht 5' 10"  (1.778 m)   Wt 175 lb 4.8 oz (79.5 kg)   BMI 25.15 kg/m?  ?Wt Readings from Last 3 Encounters:  ?03/25/22 175 lb 4.8 oz (79.5 kg)  ?03/17/22 175 lb 6.4 oz (79.6 kg)  ?03/10/22 174 lb 14.4 oz (79.3 kg)  ? ? ? ?Health Maintenance Due  ?Topic Date Due  ? Pneumonia Vaccine 43+ Years old (1 - PCV) Never done  ? Zoster Vaccines- Shingrix (1 of 2) Never done  ? ? ?There are no preventive care reminders to display for this patient. ? ?Lab Results  ?Component Value Date  ? TSH 0.05 (L) 03/11/2022  ? ?Lab Results  ?Component Value  Date  ? WBC 6.1 03/11/2022  ? HGB 11.2 (L) 03/11/2022  ? HCT 35.4 (L) 03/11/2022  ? MCV 75.6 (L) 03/11/2022  ? PLT 153 03/11/2022  ? ?Lab Results  ?Component Value Date  ? NA 144 03/11/2022  ? K 4.7 03/11/2022  ? CO2 27 03/11/2022  ? GLUCOSE 94 03/11/2022  ? BUN 17 03/11/2022  ? CREATININE 1.22 03/11/2022  ? BILITOT 0.4 03/11/2022  ? AST 19 03/11/2022  ? ALT 21 03/11/2022  ? PROT 6.7 03/11/2022  ? CALCIUM 10.0 03/11/2022  ? ANIONGAP 7 12/27/2016  ? EGFR 59 (L) 03/11/2022  ? ?Lab Results  ?Component Value Date  ? CHOL 215 (H) 03/11/2022  ? ?Lab Results  ?Component Value Date  ? HDL 94 03/11/2022  ? ?Lab Results  ?Component Value Date  ? LDLCALC 108 (H) 03/11/2022  ? ?Lab Results  ?Component Value Date  ? TRIG 44 03/11/2022  ? ?Lab Results  ?Component Value Date  ? CHOLHDL 2.3 03/11/2022  ? ?No results found for: HGBA1C ? ?  ?Assessment & Plan:  ? ?Problem List Items Addressed This Visit   ?None ?I explained to the patient that before filling the documentation of disability we require the patient to  be evaluated by the occupational therapist. ? ?Patient at present does not want the form to be filled and if needed will contact again.  ? ?No orders of the defined types were placed in this encounter. ? ? ? ?Follow-up: No follow-ups on file.  ? ? ?Theresia Lo, NP ?

## 2022-04-01 ENCOUNTER — Encounter: Payer: Self-pay | Admitting: Nurse Practitioner

## 2022-10-04 ENCOUNTER — Ambulatory Visit (INDEPENDENT_AMBULATORY_CARE_PROVIDER_SITE_OTHER): Payer: Medicare HMO | Admitting: Internal Medicine

## 2022-10-04 DIAGNOSIS — C61 Malignant neoplasm of prostate: Secondary | ICD-10-CM | POA: Diagnosis not present

## 2022-10-04 DIAGNOSIS — E785 Hyperlipidemia, unspecified: Secondary | ICD-10-CM | POA: Diagnosis not present

## 2022-10-04 DIAGNOSIS — I10 Essential (primary) hypertension: Secondary | ICD-10-CM | POA: Diagnosis not present

## 2022-10-04 DIAGNOSIS — D649 Anemia, unspecified: Secondary | ICD-10-CM | POA: Diagnosis not present

## 2022-10-04 DIAGNOSIS — R2689 Other abnormalities of gait and mobility: Secondary | ICD-10-CM

## 2022-10-05 LAB — CBC WITH DIFFERENTIAL/PLATELET
Absolute Monocytes: 314 cells/uL (ref 200–950)
Basophils Absolute: 22 cells/uL (ref 0–200)
Basophils Relative: 0.4 %
Eosinophils Absolute: 88 cells/uL (ref 15–500)
Eosinophils Relative: 1.6 %
HCT: 40.2 % (ref 38.5–50.0)
Hemoglobin: 12.8 g/dL — ABNORMAL LOW (ref 13.2–17.1)
Lymphs Abs: 1320 cells/uL (ref 850–3900)
MCH: 25.1 pg — ABNORMAL LOW (ref 27.0–33.0)
MCHC: 31.8 g/dL — ABNORMAL LOW (ref 32.0–36.0)
MCV: 78.8 fL — ABNORMAL LOW (ref 80.0–100.0)
MPV: 13.1 fL — ABNORMAL HIGH (ref 7.5–12.5)
Monocytes Relative: 5.7 %
Neutro Abs: 3757 cells/uL (ref 1500–7800)
Neutrophils Relative %: 68.3 %
Platelets: 144 10*3/uL (ref 140–400)
RBC: 5.1 10*6/uL (ref 4.20–5.80)
RDW: 14.8 % (ref 11.0–15.0)
Total Lymphocyte: 24 %
WBC: 5.5 10*3/uL (ref 3.8–10.8)

## 2022-10-05 LAB — COMPLETE METABOLIC PANEL WITH GFR
AG Ratio: 1.5 (calc) (ref 1.0–2.5)
ALT: 18 U/L (ref 9–46)
AST: 16 U/L (ref 10–35)
Albumin: 4.3 g/dL (ref 3.6–5.1)
Alkaline phosphatase (APISO): 90 U/L (ref 35–144)
BUN: 21 mg/dL (ref 7–25)
CO2: 22 mmol/L (ref 20–32)
Calcium: 9.9 mg/dL (ref 8.6–10.3)
Chloride: 115 mmol/L — ABNORMAL HIGH (ref 98–110)
Creat: 1.2 mg/dL (ref 0.70–1.22)
Globulin: 2.9 g/dL (calc) (ref 1.9–3.7)
Glucose, Bld: 104 mg/dL — ABNORMAL HIGH (ref 65–99)
Potassium: 4.8 mmol/L (ref 3.5–5.3)
Sodium: 155 mmol/L — ABNORMAL HIGH (ref 135–146)
Total Bilirubin: 0.3 mg/dL (ref 0.2–1.2)
Total Protein: 7.2 g/dL (ref 6.1–8.1)
eGFR: 60 mL/min/{1.73_m2} (ref >=60)

## 2022-10-05 LAB — TSH: TSH: 0.65 mIU/L (ref 0.40–4.50)

## 2022-10-05 LAB — PSA: PSA: <0.04 ng/mL (ref <=4.00)

## 2022-10-06 ENCOUNTER — Encounter: Payer: Self-pay | Admitting: Internal Medicine

## 2022-10-06 NOTE — Assessment & Plan Note (Signed)
Blood pressure is stable 

## 2022-10-06 NOTE — Assessment & Plan Note (Signed)
Hypercholesterolemia  I advised the patient to follow Mediterranean diet This diet is rich in fruits vegetables and whole grain, and This diet is also rich in fish and lean meat Patient should also eat a handful of almonds or walnuts daily Recent heart study indicated that average follow-up on this kind of diet reduces the cardiovascular mortality by 50 to 70%== 

## 2022-10-06 NOTE — Assessment & Plan Note (Signed)
Stable at the present time. 

## 2022-10-06 NOTE — Assessment & Plan Note (Signed)
Patient is referred to urology for that

## 2022-10-06 NOTE — Progress Notes (Signed)
Established Patient Office Visit  Subjective:  Patient ID: Emerson Schreifels, male    DOB: 06-14-1941  Age: 81 y.o. MRN: [id number]  CC: No chief complaint on file.   HPI  Maksymilian Mabey presents for general checkup.  Patient is known to have hypertension and history of prostate cancer.  Blood pressure is stable  Past Medical History:  Diagnosis Date   Hypertension    Prostate cancer [facility])     Past Surgical History:  Procedure Laterality Date   CATARACT EXTRACTION N/A     History reviewed. No pertinent family history.  Social History   Socioeconomic History   Marital status: Married    Spouse name: Not on file   Number of children: Not on file   Years of education: Not on file   Highest education level: Not on file  Occupational History   Not on file  Tobacco Use   Smoking status: Never   Smokeless tobacco: Not on file  Substance and Sexual Activity   Alcohol use: No   Drug use: Never   Sexual activity: Not Currently  Other Topics Concern   Not on file  Social History Narrative   Not on file   Social Determinants of Health   Financial Resource Strain: Not on file  Food Insecurity: Not on file  Transportation Needs: Not on file  Physical Activity: Not on file  Stress: Not on file  Social Connections: Not on file  Intimate Partner Violence: Not on file     Current Outpatient Medications:    abiraterone acetate (ZYTIGA) 250 MG tablet, Take 250 mg by mouth in the morning, at noon, in the evening, and at bedtime., Disp: , Rfl:    amLODipine (NORVASC) 10 MG tablet, Take 1 tablet by mouth daily., Disp: , Rfl:    aspirin 81 MG EC tablet, Take 81 mg by mouth daily at 2 PM., Disp: , Rfl:    Enoxaparin Sodium (LOVENOX [place]), Inject 1 Package into the skin 2 (two) times daily., Disp: , Rfl:    ferrous sulfate 325 (65 FE) MG tablet, Take 1 tablet by mouth daily., Disp: , Rfl:    lisinopril (ZESTRIL) 20 MG tablet, Take 10 mg by mouth daily at 2 PM., Disp: , Rfl:     Misc. Devices ([place]) MISC, 1 each by Does not apply route as needed., Disp: 1 each, Rfl: 0   predniSONE (DELTASONE) 5 MG tablet, Take 5 mg by mouth every other day., Disp: , Rfl:    No Known Allergies  ROS Review of Systems  Constitutional: Negative.   HENT: Negative.    Eyes: Negative.   Respiratory: Negative.    Cardiovascular: Negative.   Gastrointestinal: Negative.   Endocrine: Negative.   Genitourinary: Negative.   Musculoskeletal: Negative.   Skin: Negative.   Allergic/Immunologic: Negative.   Neurological: Negative.   Hematological: Negative.   Psychiatric/Behavioral: Negative.    All other systems reviewed and are negative.     Objective:    Physical Exam Vitals reviewed.  Constitutional:      Appearance: Normal appearance.  HENT:     Mouth/Throat:     Mouth: Mucous membranes are moist.  Eyes:     Pupils: Pupils are equal, round, and reactive to light.  Neck:     Vascular: No carotid bruit.  Cardiovascular:     Rate and Rhythm: Normal rate and regular rhythm.     Pulses: Normal pulses.     Heart sounds: Normal heart sounds.  Pulmonary:     Effort: Pulmonary effort is normal.     Breath sounds: Normal breath sounds.  Abdominal:     General: Bowel sounds are normal.     Palpations: Abdomen is soft. There is no hepatomegaly, splenomegaly or mass.     Tenderness: There is no abdominal tenderness.     Hernia: No hernia is present.  Musculoskeletal:     Cervical back: Neck supple.     Right lower leg: No edema.     Left lower leg: No edema.  Skin:    Findings: No rash.  Neurological:     Mental Status: He is alert and oriented to person, place, and time.     Motor: No weakness.  Psychiatric:        Mood and Affect: Mood normal.        Behavior: Behavior normal.     There were no vitals taken for this visit. Wt Readings from Last 3 Encounters:  03/25/22 175 lb 4.8 oz (79.5 kg)  03/17/22 175 lb 6.4 oz (79.6 kg)  03/10/22 174 lb 14.4  oz (79.3 kg)     Health Maintenance Due  Topic Date Due   Medicare Annual Wellness (AWV)  Never done   Zoster Vaccines- Shingrix (1 of 2) Never done   Pneumonia Vaccine 19+ Years old (1 - PCV) Never done   COVID-19 Vaccine (5 - Pfizer risk series) 12/16/2021   INFLUENZA VACCINE  07/12/2022    There are no preventive care reminders to display for this patient.  Lab Results  Component Value Date   TSH 0.65 10/04/2022   Lab Results  Component Value Date   WBC 5.5 10/04/2022   HGB 12.8 (L) 10/04/2022   HCT 40.2 10/04/2022   MCV 78.8 (L) 10/04/2022   PLT 144 10/04/2022   Lab Results  Component Value Date   NA 155 (H) 10/04/2022   K 4.8 10/04/2022   CO2 22 10/04/2022   GLUCOSE 104 (H) 10/04/2022   BUN 21 10/04/2022   CREATININE 1.20 10/04/2022   BILITOT 0.3 10/04/2022   AST 16 10/04/2022   ALT 18 10/04/2022   PROT 7.2 10/04/2022   CALCIUM 9.9 10/04/2022   ANIONGAP 7 12/27/2016   EGFR 60 10/04/2022   Lab Results  Component Value Date   CHOL 215 (H) 03/11/2022   Lab Results  Component Value Date   HDL 94 03/11/2022   Lab Results  Component Value Date   LDLCALC 108 (H) 03/11/2022   Lab Results  Component Value Date   TRIG 44 03/11/2022   Lab Results  Component Value Date   CHOLHDL 2.3 03/11/2022   No results found for: "HGBA1C"    Assessment & Plan:   Problem List Items Addressed This Visit       Cardiovascular and Mediastinum   Hypertension - Primary    Blood pressure is stable.      Relevant Orders   COMPLETE METABOLIC PANEL WITH GFR (Completed)   TSH (Completed)     Genitourinary   Prostate cancer [facility])    Patient is referred to urology for that      Relevant Orders   PSA (Completed)     Other   Anemia    Stable at the present time      Relevant Orders   CBC with Differential/Platelet (Completed)   COMPLETE METABOLIC PANEL WITH GFR (Completed)   Balance disorder   Relevant Orders   CBC with Differential/Platelet (Completed)    TSH (  Completed)   Hyperlipidemia    Hypercholesterolemia  I advised the patient to follow Mediterranean diet This diet is rich in fruits vegetables and whole grain, and This diet is also rich in fish and lean meat Patient should also eat a handful of almonds or walnuts daily Recent heart study indicated that average follow-up on this kind of diet reduces the cardiovascular mortality by 50 to 70%==       No orders of the defined types were placed in this encounter.   Follow-up: No follow-ups on file.    Cletis Athens, MD

## 2022-10-12 ENCOUNTER — Ambulatory Visit: Payer: Medicare HMO | Admitting: Internal Medicine
